# Patient Record
Sex: Male | Born: 2005 | Race: White | Hispanic: No | Marital: Single | Smoking: Current every day smoker
Health system: Southern US, Community
[De-identification: ages and names within clinical notes are randomized; demographics above are authoritative.]

## PROBLEM LIST (undated history)

## (undated) DIAGNOSIS — R011 Cardiac murmur, unspecified: Secondary | ICD-10-CM

## (undated) DIAGNOSIS — F909 Attention-deficit hyperactivity disorder, unspecified type: Secondary | ICD-10-CM

## (undated) DIAGNOSIS — F32A Depression, unspecified: Secondary | ICD-10-CM

## (undated) DIAGNOSIS — R4689 Other symptoms and signs involving appearance and behavior: Secondary | ICD-10-CM

## (undated) DIAGNOSIS — J45909 Unspecified asthma, uncomplicated: Secondary | ICD-10-CM

## (undated) DIAGNOSIS — F419 Anxiety disorder, unspecified: Secondary | ICD-10-CM

## (undated) DIAGNOSIS — F329 Major depressive disorder, single episode, unspecified: Secondary | ICD-10-CM

## (undated) DIAGNOSIS — F429 Obsessive-compulsive disorder, unspecified: Secondary | ICD-10-CM

## (undated) HISTORY — PX: TYMPANOSTOMY: SHX2586

## (undated) HISTORY — DX: Other symptoms and signs involving appearance and behavior: R46.89

## (undated) HISTORY — DX: Obsessive-compulsive disorder, unspecified: F42.9

## (undated) HISTORY — DX: Unspecified asthma, uncomplicated: J45.909

## (undated) HISTORY — DX: Attention-deficit hyperactivity disorder, unspecified type: F90.9

---
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
## 2024-01-14 ENCOUNTER — Telehealth (INDEPENDENT_AMBULATORY_CARE_PROVIDER_SITE_OTHER): Payer: MEDICAID | Admitting: Psychiatry

## 2024-01-14 ENCOUNTER — Telehealth (HOSPITAL_COMMUNITY): Payer: MEDICAID | Admitting: Psychiatry

## 2024-01-14 DIAGNOSIS — Z91199 Patient's noncompliance with other medical treatment and regimen due to unspecified reason: Secondary | ICD-10-CM

## 2024-01-14 MED ORDER — AMPHETAMINE-DEXTROAMPHETAMINE 10 MG PO TABS: 10.0000 mg | ORAL_TABLET | Freq: Two times a day (BID) | ORAL | 0 refills | Status: DC

## 2024-01-17 NOTE — Progress Notes (Signed)
 No show

## 2024-01-20 ENCOUNTER — Encounter (HOSPITAL_COMMUNITY): Payer: Self-pay | Admitting: Psychiatry

## 2024-01-20 ENCOUNTER — Telehealth (HOSPITAL_COMMUNITY): Payer: MEDICAID | Admitting: Psychiatry

## 2024-01-20 DIAGNOSIS — F902 Attention-deficit hyperactivity disorder, combined type: Secondary | ICD-10-CM

## 2024-01-20 DIAGNOSIS — F3110 Bipolar disorder, current episode manic without psychotic features, unspecified: Secondary | ICD-10-CM

## 2024-01-20 DIAGNOSIS — F411 Generalized anxiety disorder: Secondary | ICD-10-CM

## 2024-01-20 MED ORDER — LAMOTRIGINE 100 MG PO TABS: 100.0000 mg | ORAL_TABLET | Freq: Every day | ORAL | 2 refills | Status: DC

## 2024-01-20 MED ORDER — CLONAZEPAM 0.5 MG PO TABS: 0.5000 mg | ORAL_TABLET | Freq: Two times a day (BID) | ORAL | 2 refills | Status: DC

## 2024-01-20 MED ORDER — TRAZODONE HCL 50 MG PO TABS: 75.0000 mg | ORAL_TABLET | Freq: Every day | ORAL | 2 refills | Status: DC

## 2024-01-20 MED ORDER — AMPHETAMINE-DEXTROAMPHETAMINE 10 MG PO TABS: 10.0000 mg | ORAL_TABLET | Freq: Two times a day (BID) | ORAL | 0 refills | Status: DC

## 2024-01-20 NOTE — Progress Notes (Signed)
 Virtual Visit via Video Note  I connected with Jerry Love on 01/20/24 at  1:20 PM EDT by a video enabled telemedicine application and verified that I am speaking with the correct person using two identifiers.  Location: Patient: home Provider: office   I discussed the limitations of evaluation and management by telemedicine and the availability of in person appointments. The patient expressed understanding and agreed to proceed.      I discussed the assessment and treatment plan with the patient. The patient was provided an opportunity to ask questions and all were answered. The patient agreed with the plan and demonstrated an understanding of the instructions.   The patient was advised to call back or seek an in-person evaluation if the symptoms worsen or if the condition fails to improve as anticipated.  I provided 20 minutes of non-face-to-face time during this encounter.   Alfredia Annas, MD  [facility] MD/PA/NP OP Progress Note  01/20/2024 1:35 PM Jerry Love  MRN:  [id number]  Chief Complaint:  Chief Complaint  Patient presents with   Anxiety   Depression   ADHD   Follow-up   HPI: This patient is a 18 year old white male lives with his mother in [place].  He is currently trying to get his high school diploma online and is not working.  He states that his girlfriend recently moved in as well.  The patient returns for follow-up regarding his mood swings anxiety and ADHD after about 6 weeks.  He has a history of polysubstance abuse but claims he is not using drugs or alcohol now.  Overall the patient states he is doing fairly well.  He has decided to go back to [employer] to get his high school diploma online.  He is working on the classes at home and so far he has been able to stay focused.  The Adderall 10 mg twice daily has been working well for him.  He denies anxiety or depressive symptoms or mood swings.  He has not been angry or volatile.  He is not  using drugs or alcohol.  He is sleeping well. Visit Diagnosis:    ICD-10-CM   1. Generalized anxiety disorder  F41.1     2. Attention deficit hyperactivity disorder (ADHD), combined type  F90.2     3. 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   Substance induced mood disorder (HCC) 07/23/2022   Tobacco use disorder 07/23/2022   Unspecified asthma(493.90) 01/04/2013    Past Surgical History:  Procedure Laterality Date   ADENOIDECTOMY Bilateral 2009   TYMPANOSTOMY Bilateral 2006 & 2009  Family Psychiatric History: See below  Family History:  Family History  Problem Relation Age of Onset   Anxiety disorder Mother    Depression Mother    Bipolar disorder Mother    Migraines Mother    Depression Father    Learning disabilities Father    Drug abuse Father    ADD / ADHD Father    Migraines Maternal Grandmother    Depression Maternal Grandmother    Schizophrenia Other        Mother's Aunt & Father's Uncle    Depression Maternal Aunt    Seizures Neg Hx    Autism Neg Hx     Social History:  Social History   Socioeconomic History   Marital status: Single    Spouse name: Not on file   Number of children: Not on file   Years of education: Not on file   Highest education level: Not on file  Occupational History   Not on file  Tobacco Use   Smoking status: Every Day    Current packs/day: 1.00    Types: Cigarettes    Passive exposure: Yes   Smokeless tobacco: Never  Vaping Use   Vaping status: Every Day   Substances: Nicotine , Mixture of cannabinoids  Substance and Sexual Activity   Alcohol use: Yes   Drug use: Not Currently    Types: Marijuana, Amphetamines, Benzodiazepines, Crack cocaine, Cocaine, Fentanyl, GHB, Heroin, Hydrocodone, Ketamine, LSD, MDMA (Ecstacy), Methamphetamines, Methylphenidate , Oxycodone, PCP, Psilocybin, Solvent inhalants   Sexual activity: Yes  Other Topics Concern   Not on file  Social History Narrative   Lives with motherHe does not have any siblings. He enjoys playing video games, swimming.    Attends [employer] and is in ninth grade.      Father family history unknown.    Social Drivers of Corporate investment banker Strain: Not on file  Food Insecurity: Not on file  Transportation Needs: Not on file  Physical Activity: Not on file  Stress: Not on file  Social Connections: Not on file    Allergies:  Allergies  Allergen Reactions   Lisdexamfetamine Other (See Comments)    Hallucinations   Methylphenidate  Rash    Metabolic Disorder Labs: Lab Results  Component Value Date   HGBA1C 5.0 11/05/2020   MPG 96.8 11/05/2020   No results found for: PROLACTIN Lab Results  Component Value Date   CHOL 157 11/05/2020   TRIG 45 11/05/2020   HDL 53 11/05/2020   CHOLHDL 3.0 11/05/2020   VLDL 9 11/05/2020   LDLCALC 95 11/05/2020   Lab Results  Component Value Date   TSH 0.805 11/05/2020   TSH 0.980 06/09/2018    Therapeutic  Level Labs: No results found for: LITHIUM Lab Results  Component Value Date   VALPROATE 71.0 09/23/2021   No results found for: CBMZ  Current Medications: Current Outpatient Medications  Medication Sig Dispense Refill   amphetamine -dextroamphetamine  (ADDERALL) 10 MG tablet Take 1 tablet (10 mg total) by mouth 2 (two) times daily with a meal. 60 tablet 0   clonazePAM  (KLONOPIN ) 0.5 MG tablet Take 1 tablet (0.5 mg total) by mouth 2 (two) times daily. 60 tablet 2   lamoTRIgine  (LAMICTAL ) 100 MG tablet Take 1 tablet (100 mg total) by mouth daily. 30 tablet 2   Multiple Vitamin (MULTIVITAMIN WITH MINERALS) TABS tablet Take 1 tablet by mouth daily.     traZODone  (DESYREL ) 50 MG tablet Take 1.5 tablets (75 mg total) by mouth at  bedtime. 45 tablet 2   No current facility-administered medications for this visit.     Musculoskeletal: Strength & Muscle Tone: within normal limits Gait & Station: normal Patient leans: N/A  Psychiatric Specialty Exam: Review of Systems  All other systems reviewed and are negative.   There were no vitals taken for this visit.There is no height or weight on file to calculate BMI.  General Appearance: Casual and Fairly Groomed  Eye Contact:  Good  Speech:  Clear and Coherent  Volume:  Normal  Mood:  Euthymic  Affect:  Congruent  Thought Process:  Goal Directed  Orientation:  Full (Time, Place, and Person)  Thought Content: WDL   Suicidal Thoughts:  No  Homicidal Thoughts:  No  Memory:  Immediate;   Good Recent;   Good Remote;   NA  Judgement:  Fair  Insight:  Shallow  Psychomotor Activity:  Normal  Concentration:  Concentration: Good and Attention Span: Good  Recall:  Good  Fund of Knowledge: Fair  Language: Good  Akathisia:  No  Handed:  Right  AIMS (if indicated): not done  Assets:  Communication Skills Desire for Improvement Physical Health Resilience Social Support  ADL's:  Intact  Cognition: WNL  Sleep:  Good    Screenings: AIMS    Flowsheet Row Ad[facility] ([facility]  AIMS Total Score 0   PHQ2-9    Flowsheet Row Video Visit from 09/07/2022 in [facility] Outpatient [facility]al Health at [place] Video Visit from 08/17/2022 in [facility] at [place] Video Visit from 05/07/2022 in [facility] at [place] Video Visit from 11/18/2021 in [facility] Outpatient Behavioral Health at [place] Video Visit from 10/21/2021 in [facility] at [facility] Total Score 0 3 2 0 0  PHQ-9 Total Score -- 14 11 -- --   Flowsheet Row Video Visit from 09/07/2022 in [facility] at [place] Video Visit from 08/17/2022 in [facility] Outpatient Behavioral Health at [place] Admission (Discharged) from [facility]  C-SSRS RISK CATEGORY Error: Q3, 4, or 5 should not be populated when Q2 is No Error: Q3, 4, or 5 should not be populated when Q2 is No High Risk     Assessment and Plan: This patient is a 18 year old male with a history of ADHD possible bipolar disorder and/or substance-induced mood disorder and generalized anxiety.  He continues to do well on his current regimen.  He will continue trazodone  75 mg at bedtime for sleep, Lamictal  100 mg daily for mood stabilization, Adderall 10 mg twice daily for ADHD and clonazepam  0.5 mg twice daily for anxiety.  He will return to see me in 2 months  Collaboration of Care: Collaboration of Care: Primary Care Provider AEB notes will be shared with PCP at patient's request  Patient/Guardian was advised Release of Information must be obtained prior to any record release in order to collaborate their care with an outside provider. Patient/Guardian was advised if they have not already done so to contact the registration department to sign all  necessary forms in order for us  to release information regarding their care.   Consent: Patient/Guardian gives verbal consent for treatment and assignment of benefits for services provided during this visit. Patient/Guardian expressed understanding and agreed to proceed.    Alfredia Annas, MD 01/20/2024, 1:35 PM

## 2024-03-21 ENCOUNTER — Encounter (HOSPITAL_COMMUNITY): Payer: Self-pay | Admitting: Psychiatry

## 2024-03-21 ENCOUNTER — Telehealth (HOSPITAL_COMMUNITY): Payer: MEDICAID | Admitting: Psychiatry

## 2024-03-21 DIAGNOSIS — F3110 Bipolar disorder, current episode manic without psychotic features, unspecified: Secondary | ICD-10-CM

## 2024-03-21 DIAGNOSIS — F902 Attention-deficit hyperactivity disorder, combined type: Secondary | ICD-10-CM

## 2024-03-21 DIAGNOSIS — F411 Generalized anxiety disorder: Secondary | ICD-10-CM

## 2024-03-21 MED ORDER — TRAZODONE HCL 50 MG PO TABS: 75.0000 mg | ORAL_TABLET | Freq: Every day | ORAL | 2 refills | Status: DC

## 2024-03-21 MED ORDER — AMPHETAMINE-DEXTROAMPHETAMINE 15 MG PO TABS: 15.0000 mg | ORAL_TABLET | Freq: Two times a day (BID) | ORAL | 0 refills | Status: DC

## 2024-03-21 MED ORDER — LAMOTRIGINE 100 MG PO TABS: 100.0000 mg | ORAL_TABLET | Freq: Every day | ORAL | 2 refills | Status: DC

## 2024-03-21 MED ORDER — CLONAZEPAM 0.5 MG PO TABS
0.5000 mg | ORAL_TABLET | Freq: Two times a day (BID) | ORAL | 2 refills | Status: DC
Start: 2024-03-21 — End: 2024-06-01

## 2024-03-21 NOTE — Progress Notes (Signed)
 Virtual Visit via Video Note  I connected with Jerry Love on 03/21/24 at  4:20 PM EDT by a video enabled telemedicine application and verified that I am speaking with the correct person using two identifiers.  Location: Patient: home Provider: office   I discussed the limitations of evaluation and management by telemedicine and the availability of in person appointments. The patient expressed understanding and agreed to proceed.     I discussed the assessment and treatment plan with the patient. The patient was provided an opportunity to ask questions and all were answered. The patient agreed with the plan and demonstrated an understanding of the instructions.   The patient was advised to call back or seek an in-person evaluation if the symptoms worsen or if the condition fails to improve as anticipated.  I provided 20 minutes of non-face-to-face time during this encounter.   Jerry Gull, MD  [facility] MD/PA/NP OP Progress Note  03/21/2024 4:30 PM Jerry Love  MRN:  [id number]  Chief Complaint:  Chief Complaint  Patient presents with   Anxiety   ADHD   Follow-up   HPI: This patient is a 18 year old white male who is living with his girlfriend in [place].  He recently received his high school diploma.  He is working in a Arts development officer.  The patient returns for follow-up regarding his mood swings anxiety and ADHD after about 6 weeks.  He has a history of polysubstance abuse but is no longer using drugs or alcohol.  The patient has been doing quite well.  He completed his high school diploma through [employer] online.  He went through a graduation ceremony recently.  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  He states that he and his girlfriend are living in his mother's house and the mother moved in with her own boyfriend.  He is doing better not living with  the mother because they are not arguing all the time.  He is helping pay for utilities as is the girlfriend.  He seems much more responsible than he had been in the past.  He is not using drugs or alcohol.  He states he is not having mood swings or anxiety.  He is sleeping well.  He is on Adderall 10 mg twice daily for ADHD but he does not feel like the dosage is working that well and asked if we can go up a bit to 15 mg and I think this is reasonable. Visit Diagnosis:    ICD-10-CM   1. Generalized anxiety disorder  F41.1     2. Attention deficit hyperactivity disorder (ADHD), combined type  F90.2     3. 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   Substance induced mood disorder (HCC) 07/23/2022   Tobacco use disorder 07/23/2022   Unspecified asthma(493.90) 01/04/2013    Past Surgical History:  Procedure Laterality Date   ADENOIDECTOMY Bilateral 2009   TYMPANOSTOMY Bilateral 2006 & 2009    Family Psychiatric History: See  below  Family History:  Family History  Problem Relation Age of Onset   Anxiety disorder Mother    Depression Mother    Bipolar disorder Mother    Migraines Mother    Depression Father    Learning disabilities Father    Drug abuse Father    ADD / ADHD Father    Migraines Maternal Grandmother    Depression Maternal Grandmother    Schizophrenia Other        Mother's Aunt & Father's Uncle   Depression Maternal Aunt    Seizures Neg Hx    Autism Neg Hx     Social History:  Social History   Socioeconomic History   Marital status: Single    Spouse name: Not on file   Number of children: Not on file   Years of education: Not on file   Highest education level: Not on file  Occupational History   Not on file  Tobacco Use   Smoking status: Every Day    Current packs/day: 1.00    Types: Cigarettes    Passive exposure: Yes   Smokeless tobacco: Never  Vaping Use   Vaping status: Every Day   Substances: Nicotine , Mixture of cannabinoids  Substance and Sexual Activity   Alcohol use: Yes   Drug use: Not Currently    Types: Marijuana, Amphetamines, Benzodiazepines, Crack cocaine, Cocaine, Fentanyl, GHB, Heroin, Hydrocodone, Ketamine, LSD, MDMA (Ecstacy), Methamphetamines, Methylphenidate , Oxycodone, PCP, Psilocybin, Solvent inhalants   Sexual activity: Yes  Other Topics Concern   Not on file  Social History Narrative   Lives with motherHe does not have any siblings. He enjoys playing video games, swimming.    Attends [employer] and is in ninth grade.      Father family history unknown.    Social Drivers of Corporate investment banker Strain: Not on file  Food Insecurity: Not on file  Transportation Needs: Not on file  Physical Activity: Not on file  Stress: Not on file  Social Connections: Not on file    Allergies:  Allergies  Allergen Reactions   Lisdexamfetamine Other (See Comments)    Hallucinations   Methylphenidate  Rash    Metabolic Disorder  Labs: Lab Results  Component Value Date   HGBA1C 5.0 11/05/2020   MPG 96.8 11/05/2020   No results found for: PROLACTIN Lab Results  Component Value Date   CHOL 157 11/05/2020   TRIG 45 11/05/2020   HDL 53 11/05/2020   CHOLHDL 3.0 11/05/2020   VLDL 9 11/05/2020   LDLCALC 95 11/05/2020   Lab Results  Component Value Date   TSH 0.805 11/05/2020   TSH 0.980 06/09/2018    Therapeutic Level Labs: No results found for: LITHIUM Lab Results  Component Value Date   VALPROATE 71.0 09/23/2021   No results found for: CBMZ  Current Medications: Current Outpatient Medications  Medication Sig Dispense Refill   amphetamine -dextroamphetamine  (ADDERALL) 15 MG tablet Take 1 tablet by mouth 2 (two) times daily.  60 tablet 0   amphetamine -dextroamphetamine  (ADDERALL) 15 MG tablet Take 1 tablet by mouth 2 (two) times daily. 60 tablet 0   clonazePAM  (KLONOPIN ) 0.5 MG tablet Take 1 tablet (0.5 mg total) by mouth 2 (two) times daily. 60 tablet 2   lamoTRIgine  (LAMICTAL ) 100 MG tablet Take 1 tablet (100 mg total) by mouth daily. 30 tablet 2   Multiple Vitamin (MULTIVITAMIN WITH MINERALS) TABS tablet Take 1 tablet by mouth daily.     traZODone  (DESYREL ) 50 MG tablet Take 1.5 tablets (75 mg total) by mouth at bedtime. 45 tablet 2   No current facility-administered medications for this visit.     Musculoskeletal: Strength & Muscle Tone: within normal limits Gait & Station: normal Patient leans: N/A  Psychiatric Specialty Exam: Review of Systems  Psychiatric/Behavioral:  Positive for decreased concentration.   All other systems reviewed and are negative.   There were no vitals taken for this visit.There is no height or weight on file to calculate BMI.  General Appearance: Casual and Fairly Groomed  Eye Contact:  Good  Speech:  Clear and Coherent  Volume:  Normal  Mood:  Euthymic  Affect:  Congruent  Thought Process:  Goal Directed  Orientation:  Full (Time, Place, and Person)   Thought Content: WDL   Suicidal Thoughts:  No  Homicidal Thoughts:  No  Memory:  Immediate;   Good Recent;   Good Remote;   NA  Judgement:  Good  Insight:  Fair  Psychomotor Activity:  Normal  Concentration:  Concentration: Fair and Attention Span: Fair  Recall:  Good  Fund of Knowledge: Good  Language: Good  Akathisia:  No  Handed:  Right  AIMS (if indicated): not done  Assets:  Communication Skills Desire for Improvement Physical Health Resilience Social Support Talents/Skills  ADL's:  Intact  Cognition: WNL  Sleep:  Good   Screenings: AIMS    Flowsheet Row [facility]  AIMS Total Score 0   PHQ2-9    Flowsheet Row Video Visit from 09/07/2022 in [facility] Outpatient [facility] at [place] Video Visit from 08/17/2022 in [facility] Outpatient Behavioral Health at [place] Video Visit from 05/07/2022 in [facility] at [place] Video Visit from 11/18/2021 in [facility] Outpatient Behavioral Health at [place] Video Visit from 10/21/2021 in [facility] at [facility] Total Score 0 3 2 0 0  PHQ-9 Total Score -- 14 11 -- --   Flowsheet Row Video Visit from 09/07/2022 in [facility] at [place] Video Visit from 08/17/2022 in [facility] Outpatient Behavioral Health at [place] Admission (Discharged) from [facility]  C-SSRS RISK CATEGORY Error: Q3, 4, or 5 should not be populated when Q2 is No Error: Q3, 4, or 5 should not be populated when Q2 is No High Risk     Assessment and Plan: This patient is a 18 year old male with a history of ADHD possible bipolar disorder or substance-induced mood disorder and generalized anxiety.  He is not focusing as well as he would like so we will increase Adderall to 15 mg twice daily.  He will continue trazodone   75 mg at bedtime for sleep, limit to 100 mg daily for mood stabilization and clonazepam  0.5 mg twice daily for anxiety.  Collaboration of Care: Collaboration of Care: Primary Care Provider AEB notes will be shared with PCP at patient's request  Patient/Guardian  was advised Release of Information must be obtained prior to any record release in order to collaborate their care with an outside provider. Patient/Guardian was advised if they have not already done so to contact the registration department to sign all necessary forms in order for us  to release information regarding their care.   Consent: Patient/Guardian gives verbal consent for treatment and assignment of benefits for services provided during this visit. Patient/Guardian expressed understanding and agreed to proceed.    Jerry Gull, MD 03/21/2024, 4:30 PM

## 2024-04-28 ENCOUNTER — Encounter: Payer: Self-pay | Admitting: *Deleted

## 2024-05-22 ENCOUNTER — Other Ambulatory Visit (HOSPITAL_COMMUNITY): Payer: Self-pay | Admitting: Psychiatry

## 2024-06-01 ENCOUNTER — Telehealth (HOSPITAL_COMMUNITY): Payer: MEDICAID | Admitting: Psychiatry

## 2024-06-01 ENCOUNTER — Encounter (HOSPITAL_COMMUNITY): Payer: Self-pay | Admitting: Psychiatry

## 2024-06-01 DIAGNOSIS — F411 Generalized anxiety disorder: Secondary | ICD-10-CM

## 2024-06-01 DIAGNOSIS — F902 Attention-deficit hyperactivity disorder, combined type: Secondary | ICD-10-CM

## 2024-06-01 DIAGNOSIS — F3110 Bipolar disorder, current episode manic without psychotic features, unspecified: Secondary | ICD-10-CM

## 2024-06-01 MED ORDER — AMPHETAMINE-DEXTROAMPHETAMINE 15 MG PO TABS: 1.0000 | ORAL_TABLET | Freq: Two times a day (BID) | ORAL | 0 refills | Status: DC

## 2024-06-01 MED ORDER — TRAZODONE HCL 50 MG PO TABS: 75.0000 mg | ORAL_TABLET | Freq: Every day | ORAL | 2 refills | Status: DC

## 2024-06-01 MED ORDER — LAMOTRIGINE 100 MG PO TABS: 100.0000 mg | ORAL_TABLET | Freq: Every day | ORAL | 2 refills | Status: DC

## 2024-06-01 MED ORDER — CLONAZEPAM 0.5 MG PO TABS: 0.5000 mg | ORAL_TABLET | Freq: Three times a day (TID) | ORAL | 2 refills | Status: DC

## 2024-06-01 MED ORDER — AMPHETAMINE-DEXTROAMPHETAMINE 15 MG PO TABS: 15.0000 mg | ORAL_TABLET | Freq: Two times a day (BID) | ORAL | 0 refills | Status: DC

## 2024-06-01 NOTE — Progress Notes (Signed)
 Virtual Visit via Video Note  I connected with Jerry Love on 06/01/24 at  2:40 PM EDT by a video enabled telemedicine application and verified that I am speaking with the correct person using two identifiers.  Location: Patient: home Provider: office   I discussed the limitations of evaluation and management by telemedicine and the availability of in person appointments. The patient expressed understanding and agreed to proceed.     I discussed the assessment and treatment plan with the patient. The patient was provided an opportunity to ask questions and all were answered. The patient agreed with the plan and demonstrated an understanding of the instructions.   The patient was advised to call back or seek an in-person evaluation if the symptoms worsen or if the condition fails to improve as anticipated.  I provided 20 minutes of non-face-to-face time during this encounter.   Barnie Gull, MD  [facility] MD/PA/NP OP Progress Note  06/01/2024 2:50 PM Jerry Love  MRN:  [id number]  Chief Complaint:  Chief Complaint  Patient presents with   Anxiety   ADHD   Follow-up   Depression   HPI: This patient is a 18 year old male who is living with his girlfriend in [place].  He has finished high school.  He was working in a Arts development officer but recently quit  The patient returns for follow-up after 2 months regarding his bipolar disorder generalized anxiety disorder and ADHD.  He has a history of polysubstance abuse in remission.  The patient states overall he has been doing very well.  He quit the job in the [employer] because he felt like the workers were dumping a lot of their work on him.  He states he is about to start a job at [employer].  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  He does state that his anxiety has gotten a little bit worse and asked if we can increase the clonazepam  to 3 a day.  I think this is  reasonable since his girlfriend is controlling his medications.  He denies significant depression or thoughts of self-harm.  He generally sleeps okay but sometimes he wakes up in the middle of the night and has an anxiety attack and this is when he would like to take the extra clonazepam .  The Adderall XR 15 mg is helping with his focus Visit Diagnosis:    ICD-10-CM   1. Generalized anxiety disorder  F41.1     2. Attention deficit hyperactivity disorder (ADHD), combined type  F90.2     3. 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   Substance induced mood disorder (HCC) 07/23/2022   Tobacco use disorder 07/23/2022   Unspecified asthma(493.90) 01/04/2013    Past Surgical History:  Procedure Laterality Date   ADENOIDECTOMY Bilateral 2009   TYMPANOSTOMY Bilateral 2006 & 2009    Family Psychiatric History: See below  Family History:  Family History  Problem Relation Age of Onset   Anxiety disorder Mother    Depression  Mother    Bipolar disorder Mother    Migraines Mother    Depression Father    Learning disabilities Father    Drug abuse Father    ADD / ADHD Father    Migraines Maternal Grandmother    Depression Maternal Grandmother    Schizophrenia Other        Mother's Aunt & Father's Uncle   Depression Maternal Aunt    Seizures Neg Hx    Autism Neg Hx     Social History:  Social History   Socioeconomic History   Marital status: Single    Spouse name: Not on file   Number of children: Not on file   Years of education: Not on file   Highest education level: Not on file  Occupational History   Not on file  Tobacco Use   Smoking status: Every Day    Current packs/day: 1.00    Types: Cigarettes    Passive exposure: Yes   Smokeless tobacco: Never  Vaping Use   Vaping status: Every Day   Substances: Nicotine , Mixture of cannabinoids  Substance and Sexual Activity   Alcohol use: Yes   Drug use: Not Currently    Types: Marijuana, Amphetamines, Benzodiazepines, Crack cocaine, Cocaine, Fentanyl, GHB, Heroin, Hydrocodone, Ketamine, LSD, MDMA (Ecstacy), Methamphetamines, Methylphenidate , Oxycodone, PCP, Psilocybin, Solvent inhalants   Sexual activity: Yes  Other Topics Concern   Not on file  Social History Narrative   Lives with motherHe does not have any siblings. He enjoys playing video games, swimming.    Attends [employer] and is in ninth grade.      Father family history unknown.    Social Drivers of Corporate investment banker Strain: Not on file  Food Insecurity: Not on file  Transportation Needs: Not on file  Physical Activity: Not on file  Stress: Not on file  Social Connections: Not on file    Allergies:  Allergies  Allergen Reactions   Lisdexamfetamine Other (See Comments)    Hallucinations   Methylphenidate  Rash    Metabolic Disorder Labs: Lab Results  Component Value Date   HGBA1C 5.0 11/05/2020   MPG 96.8 11/05/2020   No results found for:  PROLACTIN Lab Results  Component Value Date   CHOL 157 11/05/2020   TRIG 45 11/05/2020   HDL 53 11/05/2020   CHOLHDL 3.0 11/05/2020   VLDL 9 11/05/2020   LDLCALC 95 11/05/2020   Lab Results  Component Value Date   TSH 0.805 11/05/2020   TSH 0.980 06/09/2018    Therapeutic Level Labs: No results found for: LITHIUM Lab Results  Component Value Date   VALPROATE 71.0 09/23/2021   No results found for: CBMZ  Current Medications: Current Outpatient Medications  Medication Sig Dispense Refill   amphetamine -dextroamphetamine  (ADDERALL) 15 MG tablet Take 1 tablet by mouth 2 (two) times daily. 60 tablet 0   amphetamine -dextroamphetamine  (ADDERALL) 15 MG tablet Take 1 tablet by mouth 2 (two) times daily. 60 tablet 0   clonazePAM  (KLONOPIN ) 0.5 MG tablet Take 1 tablet (  0.5 mg total) by mouth 3 (three) times daily. 90 tablet 2   lamoTRIgine  (LAMICTAL ) 100 MG tablet Take 1 tablet (100 mg total) by mouth daily. 30 tablet 2   Multiple Vitamin (MULTIVITAMIN WITH MINERALS) TABS tablet Take 1 tablet by mouth daily.     traZODone  (DESYREL ) 50 MG tablet Take 1.5 tablets (75 mg total) by mouth at bedtime. 45 tablet 2   No current facility-administered medications for this visit.     Musculoskeletal: Strength & Muscle Tone: within normal limits Gait & Station: normal Patient leans: N/A  Psychiatric Specialty Exam: Review of Systems  Psychiatric/Behavioral:  The patient is nervous/anxious.     There were no vitals taken for this visit.There is no height or weight on file to calculate BMI.  General Appearance: Casual and Fairly Groomed  Eye Contact:  Good  Speech:  Clear and Coherent  Volume:  Normal  Mood:  Anxious and Euthymic  Affect:  Congruent  Thought Process:  Goal Directed  Orientation:  Full (Time, Place, and Person)  Thought Content: WDL   Suicidal Thoughts:  No  Homicidal Thoughts:  No  Memory:  Immediate;   Good Recent;   Good Remote;   Fair  Judgement:  Fair   Insight:  Fair  Psychomotor Activity:  Normal  Concentration:  Concentration: Good and Attention Span: Good  Recall:  Good  Fund of Knowledge: Good  Language: Good  Akathisia:  No  Handed:  Right  AIMS (if indicated): not done  Assets:  Communication Skills Desire for Improvement Physical Health Resilience Social Support  ADL's:  Intact  Cognition: WNL  Sleep:  Fair   Screenings: AIMS    Flowsheet Row Ad[facility] (Discharged[facility]  AIMS Total Score 0   PHQ2-9    Flowsheet Row Video Visit from 09/07/2022 in [facility] at [place] Video Visit from 08/17/2022 in [facility] Outpatient [facility]al Health at [place] Video Visit from 05/07/2022 in [facility] Outpatient Behavioral Health at [place] Video Visit from 11/18/2021 in [facility] Outpatient Behavioral Health at [place] Video Visit from 10/21/2021 in [facility] at [facility] Total Score 0 3 2 0 0  PHQ-9 Total Score -- 14 11 -- --   Flowsheet Row Video Visit from 09/07/2022 in [facility] at [place] Video Visit from 08/17/2022 in [facility] Outpatient Behavioral Health at [place] Admission (Discharged) from [facility]  C-SSRS RISK CATEGORY Error: Q3, 4, or 5 should not be populated when Q2 is No Error: Q3, 4, or 5 should not be populated when Q2 is No High Risk     Assessment and Plan: This patient is a 18 year old male with a history of ADHD, possible bipolar disorder or substance-induced mood disorder and generalized anxiety disorder.  He is doing well in terms of ADHD so he will continue Adderall 15 mg twice daily.  Lamictal  100 mg daily is working for mood stabilization.  He will continue trazodone  75 mg at night for sleep.  We will increase clonazepam  to 0.5 mg 3 times daily only if needed for anxiety.  He will  return to see me in 2 months  Collaboration of Care: Collaboration of Care: Primary Care Provider AEB notes will be shared with PCP at patient's request  Patient/Guardian was advised Release of Information must be obtained prior to any record release in order to collaborate their care with  an outside provider. Patient/Guardian was advised if they have not already done so to contact the registration department to sign all necessary forms in order for us  to release information regarding their care.   Consent: Patient/Guardian gives verbal consent for treatment and assignment of benefits for services provided during this visit. Patient/Guardian expressed understanding and agreed to proceed.    Barnie Gull, MD 06/01/2024, 2:50 PM

## 2024-08-28 ENCOUNTER — Other Ambulatory Visit (HOSPITAL_COMMUNITY): Payer: Self-pay | Admitting: Psychiatry

## 2024-09-04 ENCOUNTER — Other Ambulatory Visit (HOSPITAL_COMMUNITY): Payer: Self-pay | Admitting: Psychiatry

## 2024-09-04 ENCOUNTER — Telehealth (HOSPITAL_COMMUNITY): Payer: Self-pay | Admitting: *Deleted

## 2024-09-04 MED ORDER — CLONAZEPAM 0.5 MG PO TABS: 0.5000 mg | ORAL_TABLET | Freq: Three times a day (TID) | ORAL | 0 refills | Status: AC

## 2024-09-04 MED ORDER — TRAZODONE HCL 50 MG PO TABS: 75.0000 mg | ORAL_TABLET | Freq: Every day | ORAL | 0 refills | Status: AC
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
## 2024-09-04 NOTE — Telephone Encounter (Signed)
 Please call phamacy to see when clonazepam  was last filled. I cannot pull up pdmp

## 2024-09-04 NOTE — Telephone Encounter (Signed)
 Spoke with pharmacy and they stated that medications were last picked up on 08-28-2024

## 2024-09-04 NOTE — Telephone Encounter (Signed)
 I sent in 30 days, no refills. I will not do this again. He needs an appt

## 2024-09-05 ENCOUNTER — Encounter (HOSPITAL_COMMUNITY): Payer: Self-pay | Admitting: Psychiatry

## 2024-09-05 ENCOUNTER — Telehealth (HOSPITAL_COMMUNITY): Payer: MEDICAID | Admitting: Psychiatry

## 2024-09-05 DIAGNOSIS — F902 Attention-deficit hyperactivity disorder, combined type: Secondary | ICD-10-CM

## 2024-09-05 DIAGNOSIS — F411 Generalized anxiety disorder: Secondary | ICD-10-CM

## 2024-09-05 MED ORDER — AMPHETAMINE-DEXTROAMPHETAMINE 20 MG PO TABS: 20.0000 mg | ORAL_TABLET | Freq: Two times a day (BID) | ORAL | 0 refills | Status: AC

## 2024-09-05 MED ORDER — LAMOTRIGINE 100 MG PO TABS: 100.0000 mg | ORAL_TABLET | Freq: Every day | ORAL | 2 refills | Status: AC

## 2024-09-05 NOTE — Telephone Encounter (Signed)
 Patient called office back and stated that pharmacy informed him and he received medication

## 2024-09-05 NOTE — Progress Notes (Signed)
 Virtual Visit via Video Note  I connected with Jerry Love on 09/05/24 at  3:20 PM EST by a video enabled telemedicine application and verified that I am speaking with the correct person using two identifiers.  Location: Patient: home Provider: office   I discussed the limitations of evaluation and management by telemedicine and the availability of in person appointments. The patient expressed understanding and agreed to proceed.     I discussed the assessment and treatment plan with the patient. The patient was provided an opportunity to ask questions and all were answered. The patient agreed with the plan and demonstrated an understanding of the instructions.   The patient was advised to call back or seek an in-person evaluation if the symptoms worsen or if the condition fails to improve as anticipated.  I provided 20 minutes of non-face-to-face time during this encounter.   Barnie Gull, MD  [facility] MD/PA/NP OP Progress Note  09/05/2024 3:38 PM Jerry Love  MRN:  [id number]  Chief Complaint:  Chief Complaint  Patient presents with   Agitation   Anxiety   Follow-up   HPI: This patient is a 19 year old white male who is living alone in [place].  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  The patient returns for follow-up after 2 months regarding past history of bipolar disorder generalized anxiety disorder and ADHD.  The patient states in general he is doing fairly well.  He states around Thanksgiving his girlfriend left him for his best friend.  He states this was very difficult but he has not gone back to using drugs or alcohol.  He got a couple of jobs and worked and now his mother has gotten him in at the school where she works.  He does have plenty of other friends.  He states that his mood is generally been stable.  The Adderall XR however is wearing off early and he would like to increase it slightly.  He is  currently on 15 mg twice daily Visit Diagnosis:    ICD-10-CM   1. Generalized anxiety disorder  F41.1     2. 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   Substance induced mood disorder (HCC) 07/23/2022   Tobacco use disorder 07/23/2022   Unspecified asthma(493.90) 01/04/2013    Past Surgical History:  Procedure Laterality Date   ADENOIDECTOMY Bilateral 2009   TYMPANOSTOMY Bilateral 2006 & 2009    Family Psychiatric History: see below  Family History:  Family History  Problem Relation Age of Onset   Anxiety disorder Mother    Depression Mother  Bipolar disorder Mother    Migraines Mother    Depression Father    Learning disabilities Father    Drug abuse Father    ADD / ADHD Father    Migraines Maternal Grandmother    Depression Maternal Grandmother    Schizophrenia Other        Mother's Aunt & Father's Uncle   Depression Maternal Aunt    Seizures Neg Hx    Autism Neg Hx     Social History:  Social  History   Socioeconomic History   Marital status: Single    Spouse name: Not on file   Number of children: Not on file   Years of education: Not on file   Highest education level: Not on file  Occupational History   Not on file  Tobacco Use   Smoking status: Every Day    Current packs/day: 1.00    Types: Cigarettes    Passive exposure: Yes   Smokeless tobacco: Never  Vaping Use   Vaping status: Every Day   Substances: Nicotine , Mixture of cannabinoids  Substance and Sexual Activity   Alcohol use: Yes   Drug use: Not Currently    Types: Marijuana, Amphetamines, Benzodiazepines, Crack cocaine, Cocaine, Fentanyl, GHB, Heroin, Hydrocodone, Ketamine, LSD, MDMA (Ecstacy), Methamphetamines, Methylphenidate , Oxycodone, PCP, Psilocybin, Solvent inhalants   Sexual activity: Yes  Other Topics Concern   Not on file  Social History Narrative   Lives with motherHe does not have any siblings. He enjoys playing video games, swimming.    Attends [employer] and is in ninth grade.      Father family history unknown.    Social Drivers of Health   Tobacco Use: High Risk (09/05/2024)   Patient History    Smoking Tobacco Use: Every Day    Smokeless Tobacco Use: Never    Passive Exposure: Yes  Financial Resource Strain: Not on file  Food Insecurity: Not on file  Transportation Needs: Not on file  Physical Activity: Not on file  Stress: Not on file  Social Connections: Not on file  Depression (PHQ2-9): Low Risk (09/07/2022)   Depression (PHQ2-9)    PHQ-2 Score: 0  Recent Concern: Depression (PHQ2-9) - High Risk (08/17/2022)   Depression (PHQ2-9)    PHQ-2 Score: 14  Alcohol Screen: Not on file  Housing: Not on file  Utilities: Not on file  Health Literacy: Not on file    Allergies: Allergies[1]  Metabolic Disorder Labs: Lab Results  Component Value Date   HGBA1C 5.0 11/05/2020   MPG 96.8 11/05/2020   No results found for: PROLACTIN Lab Results  Component Value Date    CHOL 157 11/05/2020   TRIG 45 11/05/2020   HDL 53 11/05/2020   CHOLHDL 3.0 11/05/2020   VLDL 9 11/05/2020   LDLCALC 95 11/05/2020   Lab Results  Component Value Date   TSH 0.805 11/05/2020   TSH 0.980 06/09/2018    Therapeutic Level Labs: No results found for: LITHIUM Lab Results  Component Value Date   VALPROATE 71.0 09/23/2021   No results found for: CBMZ  Current Medications: Current Outpatient Medications  Medication Sig Dispense Refill   amphetamine -dextroamphetamine  (ADDERALL) 20 MG tablet Take 1 tablet (20 mg total) by mouth 2 (two) times daily. 60 tablet 0   amphetamine -dextroamphetamine  (ADDERALL) 20 MG tablet Take 1 tablet (20 mg total) by mouth 2 (two) times daily. 60 tablet 0   clonazePAM  (KLONOPIN ) 0.5 MG tablet Take 1 tablet (0.5 mg total) by mouth 3 (three)  times daily. 90 tablet 0   lamoTRIgine  (LAMICTAL ) 100 MG tablet Take 1 tablet (100 mg total) by mouth daily. 30 tablet 2   Multiple Vitamin (MULTIVITAMIN WITH MINERALS) TABS tablet Take 1 tablet by mouth daily.     traZODone  (DESYREL ) 50 MG tablet Take 1.5 tablets (75 mg total) by mouth at bedtime. 45 tablet 0   No current facility-administered medications for this visit.     Musculoskeletal: Strength & Muscle Tone: within normal limits Gait & Station: normal Patient leans: N/A  Psychiatric Specialty Exam: Review of Systems  Psychiatric/Behavioral:  Positive for decreased concentration.   All other systems reviewed and are negative.   There were no vitals taken for this visit.There is no height or weight on file to calculate BMI.  General Appearance: Casual and Fairly Groomed  Eye Contact:  Good  Speech:  Clear and Coherent  Volume:  Normal  Mood:  Euthymic  Affect:  Congruent  Thought Process:  Goal Directed  Orientation:  Full (Time, Place, and Person)  Thought Content: WDL   Suicidal Thoughts:  No  Homicidal Thoughts:  No  Memory:  Immediate;   Good Recent;   Good Remote;   Fair   Judgement:  Good  Insight:  Fair  Psychomotor Activity:  Normal  Concentration:  Concentration: Fair and Attention Span: Fair  Recall:  Good  Fund of Knowledge: Good  Language: Good  Akathisia:  No  Handed:  Right  AIMS (if indicated): not done  Assets:  Communication Skills Desire for Improvement Physical Health Resilience Social Support Talents/Skills  ADL's:  Intact  Cognition: WNL  Sleep:  Good   Screenings: AIMS    Flowsheet Row [facility]  AIMS Total Score 0   PHQ2-9    Flowsheet Row Video Visit from 09/07/2022 in [facility] [place] Video Visit from 08/17/2022 in [facility] at [place] Video Visit from 05/07/2022 in [facility] at [place] Video Visit from 11/18/2021 in [facility] at [place] Video Visit from 10/21/2021 in [facility] Total Score 0 3 2 0 0  PHQ-9 Total Score -- 14 11 -- --   Flowsheet Row Video Visit from 09/07/2022 in [facility] [place] Video Visit from 08/17/2022 in [facility] at [place] Admission (Discharged) [facility]  C-SSRS RISK CATEGORY Error: Q3, 4, or 5 should not be populated when Q2 is No Error: Q3, 4, or 5 should not be populated when Q2 is No High Risk     Assessment and Plan: This patient is a 19 year old male with a history of ADHD possible bipolar disorder or substance-induced mood disorder and generalized anxiety disorder.  Since Adderall is wearing off we will increase to Adderall 20 mg twice daily for ADHD.  He will continue Lamictal  100 mg daily for mood stabilization, trazodone  75 mg at bedtime for sleep and clonazepam  0.5 mg 3 times daily if needed for anxiety.   He will return to see me in 2 months  Collaboration of Care: Collaboration of Care: Primary Care Provider AEB notes to be shared with PCP at patient's request  Patient/Guardian was advised Release of Information must be obtained prior to any record release in order to collaborate their care with an outside provider. Patient/Guardian was advised if they have not  already done so to contact the registration department to sign all necessary forms in order for us  to release information regarding their care.   Consent: Patient/Guardian gives verbal consent for treatment and assignment of benefits for services provided during this visit. Patient/Guardian expressed understanding and agreed to proceed.    Barnie Gull, MD 09/05/2024, 3:38 PM     [1]  Allergies Allergen Reactions   Lisdexamfetamine  Other (See Comments)    Hallucinations   Methylphenidate  Rash

## 2024-09-05 NOTE — Telephone Encounter (Signed)
 lmom
